# Patient Record
Sex: Female | Born: 1940 | Race: White | Hispanic: No | Smoking: Former smoker
Health system: Southern US, Community
[De-identification: ages and names within clinical notes are randomized; demographics above are authoritative.]

## PROBLEM LIST (undated history)

## (undated) DIAGNOSIS — F419 Anxiety disorder, unspecified: Secondary | ICD-10-CM

## (undated) DIAGNOSIS — E7800 Pure hypercholesterolemia, unspecified: Secondary | ICD-10-CM

## (undated) DIAGNOSIS — M810 Age-related osteoporosis without current pathological fracture: Secondary | ICD-10-CM

## (undated) HISTORY — PX: CHOLECYSTECTOMY: SHX55

## (undated) HISTORY — PX: TUBAL LIGATION: SHX77

## (undated) HISTORY — PX: OOPHORECTOMY: SHX86

## (undated) HISTORY — PX: OTHER SURGICAL HISTORY: SHX169

---

## 2019-08-23 ENCOUNTER — Other Ambulatory Visit: Payer: Self-pay | Admitting: Neurology

## 2019-08-23 DIAGNOSIS — G20 Parkinson's disease: Secondary | ICD-10-CM

## 2019-09-06 ENCOUNTER — Ambulatory Visit
Admission: RE | Admit: 2019-09-06 | Discharge: 2019-09-06 | Disposition: A | Discharge status: Home / Self Care | Payer: Medicare HMO | Source: Ambulatory Visit | Attending: Neurology | Admitting: Neurology

## 2019-09-06 ENCOUNTER — Other Ambulatory Visit: Payer: Self-pay

## 2019-09-06 DIAGNOSIS — G20 Parkinson's disease: Secondary | ICD-10-CM | POA: Diagnosis present

## 2019-09-14 ENCOUNTER — Other Ambulatory Visit: Payer: Self-pay

## 2019-09-14 DIAGNOSIS — Z20822 Contact with and (suspected) exposure to covid-19: Secondary | ICD-10-CM

## 2019-09-15 LAB — NOVEL CORONAVIRUS, NAA: SARS-CoV-2, NAA: NOT DETECTED

## 2020-03-31 ENCOUNTER — Encounter: Payer: Self-pay | Admitting: Emergency Medicine

## 2020-03-31 ENCOUNTER — Emergency Department: Payer: Medicare HMO

## 2020-03-31 ENCOUNTER — Other Ambulatory Visit: Payer: Self-pay

## 2020-03-31 ENCOUNTER — Emergency Department
Admission: EM | Admit: 2020-03-31 | Discharge: 2020-03-31 | Disposition: A | Discharge status: Home / Self Care | Payer: Medicare HMO | Attending: Emergency Medicine | Admitting: Emergency Medicine

## 2020-03-31 DIAGNOSIS — Z7982 Long term (current) use of aspirin: Secondary | ICD-10-CM | POA: Insufficient documentation

## 2020-03-31 DIAGNOSIS — Z87891 Personal history of nicotine dependence: Secondary | ICD-10-CM | POA: Diagnosis not present

## 2020-03-31 DIAGNOSIS — J069 Acute upper respiratory infection, unspecified: Secondary | ICD-10-CM | POA: Diagnosis not present

## 2020-03-31 DIAGNOSIS — Z79899 Other long term (current) drug therapy: Secondary | ICD-10-CM | POA: Diagnosis not present

## 2020-03-31 DIAGNOSIS — R05 Cough: Secondary | ICD-10-CM | POA: Diagnosis present

## 2020-03-31 HISTORY — DX: Pure hypercholesterolemia, unspecified: E78.00

## 2020-03-31 MED ORDER — AMOXICILLIN 500 MG PO CAPS: 500.0000 mg | ORAL_CAPSULE | Freq: Three times a day (TID) | ORAL | 0 refills | Status: DC

## 2020-03-31 NOTE — ED Provider Notes (Signed)
[facility] Emergency Department Provider Note  ____________________________________________   First MD Initiated Contact with Patient 03/31/20 1334     (approximate)  I have reviewed the triage vital signs and the nursing notes.   HISTORY  Chief Complaint sinus congestion and Cough    HPI Kaitlyn Rivas is a 79 y.o. female presents emergency department complaining of sinus drainage and cough symptoms for about 5 days.  No fever.  Patient is worried she might have pneumonia.  She denies any chest pain or shortness of breath.  No swelling in the extremities.    Past Medical History:  Diagnosis Date  . High cholesterol     There are no problems to display for this patient.   Past Surgical History:  Procedure Laterality Date  . CHOLECYSTECTOMY      Prior to Admission medications   Medication Sig Start Date End Date Taking? Authorizing Provider  aspirin EC 81 MG tablet Take 81 mg by mouth daily.   Yes [provider]  carbidopa-levodopa (SINEMET IR) 25-250 MG tablet Take 1 tablet by mouth 3 (three) times daily.   Yes [provider]  citalopram (CELEXA) 40 MG tablet Take 40 mg by mouth daily.   Yes [provider]  rosuvastatin (CRESTOR) 10 MG tablet Take 10 mg by mouth daily.   Yes [provider]  amoxicillin (AMOXIL) 500 MG capsule Take 1 capsule (500 mg total) by mouth 3 (three) times daily. 03/31/20   Versie Starks, PA-C    Allergies Patient has no known allergies.  History reviewed. No pertinent family history.  Social History Social History   Tobacco Use  . Smoking status: Former Research scientist (life sciences)  . Smokeless tobacco: Never Used  Substance Use Topics  . Alcohol use: Not Currently  . Drug use: Not Currently    Review of Systems  Constitutional: No fever/chills Eyes: No visual changes. ENT: No sore throat. Respiratory: Positive cough Cardiovascular: Denies chest pain Genitourinary: Negative for  dysuria. Musculoskeletal: Negative for back pain. Skin: Negative for rash. Psychiatric: no mood changes,     ____________________________________________   PHYSICAL EXAM:  VITAL SIGNS: ED Triage Vitals  Enc Vitals Group     BP 03/31/20 1241 131/73     Pulse Rate 03/31/20 1241 80     Resp 03/31/20 1241 16     Temp 03/31/20 1241 97.8 F (36.6 C)     Temp Source 03/31/20 1241 Oral     SpO2 03/31/20 1241 94 %     Weight 03/31/20 1238 118 lb (53.5 kg)     Height 03/31/20 1238 4\' 11"  (1.499 m)     Head Circumference --      Peak Flow --      Pain Score 03/31/20 1238 0     Pain Loc --      Pain Edu? --      Excl. in [place]? --     Constitutional: Alert and oriented. Well appearing and in no acute distress. Eyes: Conjunctivae are normal.  Head: Atraumatic. Nose: No congestion/rhinnorhea. Mouth/Throat: Mucous membranes are moist.   Neck:  supple no lymphadenopathy noted Cardiovascular: Normal rate, regular rhythm. Heart sounds are normal Respiratory: Normal respiratory effort.  No retractions, lungs c t a  GU: deferred Musculoskeletal: FROM all extremities, warm and well perfused Neurologic:  Normal speech and language.  Skin:  Skin is warm, dry and intact. No rash noted. Psychiatric: Mood and affect are normal. Speech and behavior are normal.  ____________________________________________  LABS (all labs ordered are listed, but only abnormal results are displayed)  Labs Reviewed - No data to display ____________________________________________   ____________________________________________  RADIOLOGY  Chest x-ray is normal  ____________________________________________   PROCEDURES  Procedure(s) performed: No  Procedures    ____________________________________________   INITIAL IMPRESSION / ASSESSMENT AND PLAN / ED COURSE  Pertinent labs & imaging results that were available during my care of the patient were reviewed by me and considered in my medical  decision making (see chart for details).   Patient is an 79 year old female with cough and congestion for 5 days.  Concerns for pneumonia.  Patient has had both Covid vaccines.  Physical exam is very reassuring.  Patient's vitals appear to be normal.  She is in no distress at all. No  labored breathing or retractions.  Chest x-ray is normal  I did explain the findings to the patient.  Explained she does not have pneumonia.  Place her on antibiotic and she is to follow-up with her regular doctor.  Return emergency department worsening.  She states she understands will comply.  She is discharged stable condition.    Kaitlyn Rivas was evaluated in Emergency Department on 03/31/2020 for the symptoms described in the history of present illness. She was evaluated in the context of the global COVID-19 pandemic, which necessitated consideration that the patient might be at risk for infection with the SARS-CoV-2 virus that causes COVID-19. Institutional protocols and algorithms that pertain to the evaluation of patients at risk for COVID-19 are in a state of rapid change based on information released by regulatory bodies including the CDC and federal and state organizations. These policies and algorithms were followed during the patient's care in the ED.   As part of my medical decision making, I reviewed the following data within the electronic MEDICAL RECORD NUMBER Nursing notes reviewed and incorporated, Old chart reviewed, Radiograph reviewed , Notes from prior ED visits and [place] Controlled Substance Database  ____________________________________________   FINAL CLINICAL IMPRESSION(S) / ED DIAGNOSES  Final diagnoses:  Acute upper respiratory infection      NEW MEDICATIONS STARTED DURING THIS VISIT:  New Prescriptions   AMOXICILLIN (AMOXIL) 500 MG CAPSULE    Take 1 capsule (500 mg total) by mouth 3 (three) times daily.     Note:  This document was prepared using Dragon voice recognition software  and may include unintentional dictation errors.    Faythe Ghee, PA-C 03/31/20 1819    Jene Every, MD 04/05/20 [phone]

## 2020-03-31 NOTE — ED Notes (Signed)
See triage note   Presents with sinus pressure and cough  sxs' started about 5 days ago  No fever

## 2020-03-31 NOTE — ED Triage Notes (Signed)
C/O sinus congestion, post nasal drip and cough x 5 days.  AAOx3. Skin warm and dry. No SOB/ DOE.  NAD

## 2021-04-23 ENCOUNTER — Other Ambulatory Visit: Payer: Self-pay | Admitting: Internal Medicine

## 2021-04-23 DIAGNOSIS — R1313 Dysphagia, pharyngeal phase: Secondary | ICD-10-CM

## 2021-05-07 ENCOUNTER — Other Ambulatory Visit: Payer: Self-pay

## 2021-05-07 ENCOUNTER — Other Ambulatory Visit: Payer: Self-pay | Admitting: Internal Medicine

## 2021-05-07 ENCOUNTER — Ambulatory Visit
Admission: RE | Admit: 2021-05-07 | Discharge: 2021-05-07 | Disposition: A | Discharge status: Home / Self Care | Payer: Medicare HMO | Source: Ambulatory Visit | Attending: Internal Medicine | Admitting: Internal Medicine

## 2021-05-07 DIAGNOSIS — R1313 Dysphagia, pharyngeal phase: Secondary | ICD-10-CM

## 2021-05-22 ENCOUNTER — Other Ambulatory Visit: Payer: Self-pay | Admitting: Internal Medicine

## 2021-05-22 DIAGNOSIS — R1313 Dysphagia, pharyngeal phase: Secondary | ICD-10-CM

## 2021-05-22 DIAGNOSIS — T17908A Unspecified foreign body in respiratory tract, part unspecified causing other injury, initial encounter: Secondary | ICD-10-CM

## 2021-06-02 ENCOUNTER — Ambulatory Visit
Admission: RE | Admit: 2021-06-02 | Discharge: 2021-06-02 | Disposition: A | Discharge status: Home / Self Care | Payer: Medicare HMO | Source: Ambulatory Visit | Attending: Internal Medicine | Admitting: Internal Medicine

## 2021-06-02 ENCOUNTER — Other Ambulatory Visit: Payer: Self-pay

## 2021-06-02 DIAGNOSIS — R1313 Dysphagia, pharyngeal phase: Secondary | ICD-10-CM | POA: Insufficient documentation

## 2021-06-02 DIAGNOSIS — T17908A Unspecified foreign body in respiratory tract, part unspecified causing other injury, initial encounter: Secondary | ICD-10-CM

## 2021-06-02 NOTE — Therapy (Addendum)
[facility] DIAGNOSTIC RADIOLOGY [address] [place], [place], [address] Phone: [phone]   Fax:     Modified Barium Swallow  Patient Details  Name: Kaitlyn Rivas MRN: [id number] Date of Birth: [birth year] No data recorded  Encounter Date: 06/02/2021   End of Session - 06/02/21 1655     Visit Number 1    Number of Visits 1    Date for SLP Re-Evaluation 06/02/21    SLP Start Time 1305    SLP Stop Time  1415    SLP Time Calculation (min) 70 min    Activity Tolerance Patient tolerated treatment well             Past Medical History:  Diagnosis Date   High cholesterol     Past Surgical History:  Procedure Laterality Date   CHOLECYSTECTOMY      There were no vitals filed for this visit.   Subjective: Patient behavior: (alertness, ability to follow instructions, etc.): pt talkative, pleasant. Gave clear information and details re: her "difficulty" swallowing -- "I have episodes of Regurgitation of foods when I eat at meals" for ~1+ years now. This is bothersome to her b/c it has occurred when eating w/ others, and she feels her voice changes "some days". Pt stated she has "reflux"; had "polyps removed from my Esophagus before"; had "stomach surgery 40 years ago"; and "I have always had problems w/ my stomach". Intermittent difficulty swallowing Large Pills at home reported. Pt denied any Neurological or Pulmonary h/o -- No dxs of pneumonia "at all" per pt report.  Chief complaint: dysphagia  OM Exam: WNL. No unilateral weakness. Cough+. Gaga+. Upper Denture plate only - no bottom Dentition baseline.   Objective:  Radiological Procedure: A videoflouroscopic evaluation of oral-preparatory, reflex initiation, and pharyngeal phases of the swallow was performed; as well as a screening of the upper esophageal phase.  POSTURE: upright VIEW: lateral COMPENSATORY STRATEGIES: mild throat clearing intermittently b/t trials and at end of  po's taken to clear any potential laryngeal vestibule residue BOLUSES ADMINISTERED:  Thin Liquid: 9 trials  Nectar-thick Liquid: 1 trial  Honey-thick Liquid: NT  Puree: 3 trials  Mechanical Soft: 1 trial RESULTS OF EVALUATION: ORAL PREPARATORY PHASE: (The lips, tongue, and velum are observed for strength and coordination)       **Overall Severity Rating: WFL.   SWALLOW INITIATION/REFLEX: (The reflex is normal if "triggered" by the time the bolus reached the base of the tongue)  **Overall Severity Rating: MILD. Mild delay in pharyngeal swallow initiation w/ thin liquids spilling to/filling the pyriform sinuses -- this resulted in inconsistent laryngeal Penetration of thin liquids into the laryngeal vestibule. NO aspiration noted to occur.   PHARYNGEAL PHASE: (Pharyngeal function is normal if the bolus shows rapid, smooth, and continuous transit through the pharynx and there is no pharyngeal residue after the swallow)  **Overall Severity Rating: [facility]   LARYNGEAL PENETRATION: (Material entering into the laryngeal inlet/vestibule but not aspirated): inconsistent slight-min laryngeal Penetration noted w/ thin liquids. Majority of the bolus appeared to clear w/ completion of the swallow; any residue appeared to clear w/ mild throat clearing intermittently. NO buildup of laryngeal Penetration noted in the Laryngeal Vestibule as trials continued. NO aspiration followed.  ASPIRATION: NONE ESOPHAGEAL PHASE: (Screening of the upper esophagus): slightly slower clearing of increased textured boluses through the lower Cervical Esophagus (out of view below shoulders). No retrograde activity noted during the bolus trials given.   ASSESSMENT: Pt appears to present w/  adequate and grossly Functional oropharyngeal phase swallowing w/ NO aspiration noted during this study. No neuromuscular deficits noted during the oropharyngeal phases of swallowing except for mildly decreased pharyngeal sensation apparent during  the pharyngeal swallowing of thin liquids trials. 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 Any Esophageal phase dysmotility/deficits can impact the pharyngeal phase of swallowing thus impact pulmonary status.    During the oral phase, no deficits were noted; adequate bolus management, A-P transfer, and oral clearing occurred w/ all trial consistencies. During the pharyngeal phase, pt exhibited a mildly delayed pharyngeal swallow initiation w/ thin liquids(only) which were noted to spill to the pyriform sinuses as the pharyngeal swallow initiation occurred. The mild delay in pharyngeal swallow initiation w/ thin liquids spilling to/filling the pyriform sinuses resulted in Inconsistent laryngeal Penetration of thin liquids into the laryngeal vestibule w/ trace+ residue remaining above the Vocal Cords. The majority of the laryngeal Penetration appeared to clear w/ subsequent swallows. Pt was educated and instructed on using a mild throat clearing intermittently during, and at the end of, oral intake in order to assist in clearing any potential laryngeal vestibule residue -- pt was able to follow through w/ this strategy fully. NO aspiration noted to occur. This can be seen secondary to effects of GERD/LPR impacting sensation of pharynx/pharyngeal tissue. No pharyngeal residue remained post swallow indicating adequate pharyngeal pressure and laryngeal excursion during the swallow. Practice w/ swallowing Barium tablet Whole w/ a Puree was completed w/ excellent timing/swallowing/clearing -- pt was surprised and pleased.   Time during/after was taken to view video w/ pt, educate on oropharyngeal phase swallowing, and discuss the impact of the Esophageal  phase on swallowing and oral intake in general. Recommendation was given to continue f/u w/ GI for ongoing assessment/management of any Esophageal Dysmotility and GERD/LPR; education. Encouraged pt to monitor her pulmonary status along w/ her PCP for any changes including upper airway congestion, pneumonia.  Handouts were given on GERD/REFLUX precautions, Esophageal phase precautions, and diet recommendations. Pt agreed verbally.   PLAN/RECOMMENDATIONS:  A. Diet: Regular, Cut meats for ease of mastication d/t missing Lower Denture plate/Dentition Baseline. Foods Cut Small/Moistened well w/ less Breads and Meats in diet d/t Esophageal phase Dysmotility. Pills WHOLE in tsp of Puree for ease, safety of swallowing. Pt agreed.  B. Swallowing Precautions: general aspiration and REFLUX precautions (handouts given). Intermittent, mild throat clearing during and at end of meal for clearing of any potential laryngeal vestibule residue  C. Recommended consultation to: GI for f/u w/ Esophageal phase Dysmotility and Regurgitation episodes; tx and management  D. Therapy recommendations: None indicated at this time  E. Results and recommendations were discussed w/ pt; video viewed and questions answered. Handouts given on recommendations discussed and practiced. Pt gave verbal agreement.       Pharyngeal dysphagia Plan: DG SWALLOW FUNC OP MEDICARE SPEECH PATH, DG SWALLOW FUNC OP MEDICARE SPEECH PATH    Problem List There are no problems to display for this patient.    Jerilynn Som, MS, CCC-SLP Speech Language Pathologist Rehab Services [phone] [facility] 06/02/2021, 5:03 PM  [place] [facility] DIAGNOSTIC RADIOLOGY [address] [place], [place], [address] Phone: [phone]   Fax:     Name: Juliet Vasbinder MRN: [id number] Date of Birth: 1941-06-03

## 2021-07-14 ENCOUNTER — Encounter: Payer: Self-pay | Admitting: Internal Medicine

## 2021-07-15 ENCOUNTER — Ambulatory Visit: Payer: Medicare HMO | Admitting: Certified Registered Nurse Anesthetist

## 2021-07-15 ENCOUNTER — Ambulatory Visit
Admission: RE | Admit: 2021-07-15 | Discharge: 2021-07-15 | Disposition: A | Discharge status: Home / Self Care | Payer: Medicare HMO | Source: Ambulatory Visit | Attending: Internal Medicine | Admitting: Internal Medicine

## 2021-07-15 ENCOUNTER — Encounter
Admission: RE | Disposition: A | Discharge status: Home / Self Care | Payer: Self-pay | Source: Ambulatory Visit | Attending: Internal Medicine

## 2021-07-15 ENCOUNTER — Encounter: Payer: Self-pay | Admitting: Internal Medicine

## 2021-07-15 DIAGNOSIS — Z7982 Long term (current) use of aspirin: Secondary | ICD-10-CM | POA: Diagnosis not present

## 2021-07-15 DIAGNOSIS — Z980 Intestinal bypass and anastomosis status: Secondary | ICD-10-CM | POA: Diagnosis not present

## 2021-07-15 DIAGNOSIS — K289 Gastrojejunal ulcer, unspecified as acute or chronic, without hemorrhage or perforation: Secondary | ICD-10-CM | POA: Diagnosis not present

## 2021-07-15 DIAGNOSIS — D72820 Lymphocytosis (symptomatic): Secondary | ICD-10-CM | POA: Diagnosis not present

## 2021-07-15 DIAGNOSIS — Q396 Congenital diverticulum of esophagus: Secondary | ICD-10-CM | POA: Insufficient documentation

## 2021-07-15 DIAGNOSIS — R1313 Dysphagia, pharyngeal phase: Secondary | ICD-10-CM | POA: Insufficient documentation

## 2021-07-15 DIAGNOSIS — Z79899 Other long term (current) drug therapy: Secondary | ICD-10-CM | POA: Insufficient documentation

## 2021-07-15 DIAGNOSIS — K2950 Unspecified chronic gastritis without bleeding: Secondary | ICD-10-CM | POA: Insufficient documentation

## 2021-07-15 DIAGNOSIS — K2289 Other specified disease of esophagus: Secondary | ICD-10-CM | POA: Diagnosis not present

## 2021-07-15 DIAGNOSIS — K449 Diaphragmatic hernia without obstruction or gangrene: Secondary | ICD-10-CM | POA: Insufficient documentation

## 2021-07-15 DIAGNOSIS — K222 Esophageal obstruction: Secondary | ICD-10-CM | POA: Diagnosis not present

## 2021-07-15 HISTORY — DX: Anxiety disorder, unspecified: F41.9

## 2021-07-15 HISTORY — DX: Age-related osteoporosis without current pathological fracture: M81.0

## 2021-07-15 HISTORY — PX: ESOPHAGOGASTRODUODENOSCOPY (EGD) WITH PROPOFOL: SHX5813

## 2021-07-15 SURGERY — ESOPHAGOGASTRODUODENOSCOPY (EGD) WITH PROPOFOL
Anesthesia: General

## 2021-07-15 MED ORDER — SODIUM CHLORIDE 0.9 % IV SOLN: INTRAVENOUS | Status: DC

## 2021-07-15 MED ORDER — LIDOCAINE HCL (PF) 2 % IJ SOLN
INTRAMUSCULAR | Status: AC
  Filled 2021-07-15: qty 5

## 2021-07-15 MED ORDER — PROPOFOL 10 MG/ML IV BOLUS
INTRAVENOUS | Status: DC | PRN
  Administered 2021-07-15: 50 mg via INTRAVENOUS

## 2021-07-15 MED ORDER — PROPOFOL 500 MG/50ML IV EMUL
INTRAVENOUS | Status: DC | PRN
  Administered 2021-07-15: 150 ug/kg/min via INTRAVENOUS

## 2021-07-15 MED ORDER — LIDOCAINE HCL (CARDIAC) PF 100 MG/5ML IV SOSY
PREFILLED_SYRINGE | INTRAVENOUS | Status: DC | PRN
  Administered 2021-07-15: 50 mg via INTRAVENOUS

## 2021-07-15 MED ORDER — PROPOFOL 500 MG/50ML IV EMUL
INTRAVENOUS | Status: AC
  Filled 2021-07-15: qty 50

## 2021-07-15 NOTE — H&P (Signed)
Outpatient short stay form Pre-procedure 07/15/2021 9:57 AM Cornellius Rivas K. [name], M.D.  Primary Physician: Debbra Riding, PA-C  Reason for visit: Recurrent pharyngeal dysphagia  History of present illness: Pleasant 80 year old female presents with intermittent solid food and liquid dysphagia at the base of the throat to the suprasternal notch.  Recent modified barium swallow performed on 06/02/2021 was unremarkable.  Patient presents for upper endoscopy for further evaluation and possible esophageal dilation.    Current Facility-Administered Medications:    0.9 %  sodium chloride infusion, , Intravenous, Continuous, Lennis Rader, Boykin Nearing, MD  Medications Prior to Admission  Medication Sig Dispense Refill Last Dose   aspirin EC 81 MG tablet Take 81 mg by mouth daily.   07/14/2021   citalopram (CELEXA) 40 MG tablet Take 40 mg by mouth daily.   07/14/2021   Multiple Vitamin (MULTIVITAMIN) tablet Take 1 tablet by mouth daily.   07/14/2021   pravastatin (PRAVACHOL) 10 MG tablet Take 10 mg by mouth daily.   07/14/2021   vitamin B-12 (CYANOCOBALAMIN) 1000 MCG tablet Take 1,000 mcg by mouth daily.   07/14/2021   Vitamin D3 (VITAMIN D) 25 MCG tablet Take 1,000 Units by mouth daily.      amoxicillin (AMOXIL) 500 MG capsule Take 1 capsule (500 mg total) by mouth 3 (three) times daily. (Patient not taking: Reported on 07/14/2021) 30 capsule 0 Not Taking   carbidopa-levodopa (SINEMET IR) 25-250 MG tablet Take 1 tablet by mouth 3 (three) times daily. (Patient not taking: Reported on 07/15/2021)   Not Taking   omeprazole (PRILOSEC) 20 MG capsule Take 20 mg by mouth daily. (Patient not taking: Reported on 07/15/2021)   Not Taking     No Known Allergies   Past Medical History:  Diagnosis Date   Anxiety    High cholesterol    Osteoporosis     Review of systems:  Otherwise negative.    Physical Exam  Gen: Alert, oriented. Appears stated age.  HEENT: [place]/AT. PERRLA. Lungs: CTA, no wheezes. CV: RR nl S1, S2. Abd:  soft, benign, no masses. BS+ Ext: No edema. Pulses 2+    Planned procedures: Proceed with esophagogastroduodenoscopy. The patient understands the nature of the planned procedure, indications, risks, alternatives and potential complications including but not limited to bleeding, infection, perforation, damage to internal organs and possible oversedation/side effects from anesthesia. The patient agrees and gives consent to proceed.  Please refer to procedure notes for findings, recommendations and patient disposition/instructions.     Kaitlyn Rivas, M.D. Gastroenterology 07/15/2021  9:57 AM

## 2021-07-15 NOTE — Anesthesia Preprocedure Evaluation (Signed)
Anesthesia Evaluation  Patient identified by MRN, date of birth, ID band Patient awake    Reviewed: Allergy & Precautions, NPO status , Patient's Chart, lab work & pertinent test results  History of Anesthesia Complications Negative for: history of anesthetic complications  Airway Mallampati: III  TM Distance: >3 FB Neck ROM: limited    Dental  (+) Missing   Pulmonary neg shortness of breath, former smoker,    Pulmonary exam normal        Cardiovascular Exercise Tolerance: Good (-) angina(-) Past MI negative cardio ROS Normal cardiovascular exam     Neuro/Psych Anxiety negative neurological ROS  negative psych ROS   GI/Hepatic negative GI ROS, Neg liver ROS, neg GERD  ,  Endo/Other  negative endocrine ROS  Renal/GU negative Renal ROS  negative genitourinary   Musculoskeletal   Abdominal   Peds  Hematology negative hematology ROS (+)   Anesthesia Other Findings Past Medical History: No date: Anxiety No date: High cholesterol No date: Osteoporosis  Past Surgical History: No date: CHOLECYSTECTOMY No date: excision stomach ulcer No date: OOPHORECTOMY No date: TUBAL LIGATION  BMI    Body Mass Index: 25.45 kg/m      Reproductive/Obstetrics negative OB ROS                             Anesthesia Physical Anesthesia Plan  ASA: 2  Anesthesia Plan: General   Post-op Pain Management:    Induction: Intravenous  PONV Risk Score and Plan: Propofol infusion and TIVA  Airway Management Planned: Natural Airway and Nasal Cannula  Additional Equipment:   Intra-op Plan:   Post-operative Plan:   Informed Consent: I have reviewed the patients History and Physical, chart, labs and discussed the procedure including the risks, benefits and alternatives for the proposed anesthesia with the patient or authorized representative who has indicated his/her understanding and acceptance.      Dental Advisory Given  Plan Discussed with: Anesthesiologist, CRNA and Surgeon  Anesthesia Plan Comments: (Patient consented for risks of anesthesia including but not limited to:  - adverse reactions to medications - risk of airway placement if required - damage to eyes, teeth, lips or other oral mucosa - nerve damage due to positioning  - sore throat or hoarseness - Damage to heart, brain, nerves, lungs, other parts of body or loss of life  Patient voiced understanding.)        Anesthesia Quick Evaluation

## 2021-07-15 NOTE — Transfer of Care (Signed)
Immediate Anesthesia Transfer of Care Note  Patient: Kaitlyn Rivas  Procedure(s) Performed: ESOPHAGOGASTRODUODENOSCOPY (EGD) WITH PROPOFOL  Patient Location: PACU and Endoscopy Unit  Anesthesia Type:General  Level of Consciousness: drowsy  Airway & Oxygen Therapy: Patient Spontanous Breathing  Post-op Assessment: Report given to RN and Post -op Vital signs reviewed and stable  Post vital signs: Reviewed and stable  Last Vitals:  Vitals Value Taken Time  BP    Temp    Pulse 63 07/15/21 1023  Resp 19 07/15/21 1023  SpO2 100 % 07/15/21 1023  Vitals shown include unvalidated device data.  Last Pain:  Vitals:   07/15/21 0908  TempSrc: Temporal  PainSc: 0-No pain         Complications: No notable events documented.

## 2021-07-15 NOTE — Anesthesia Postprocedure Evaluation (Signed)
Anesthesia Post Note  Patient: Kaitlyn Rivas  Procedure(s) Performed: ESOPHAGOGASTRODUODENOSCOPY (EGD) WITH PROPOFOL  Patient location during evaluation: Endoscopy Anesthesia Type: General Level of consciousness: awake and alert Pain management: pain level controlled Vital Signs Assessment: post-procedure vital signs reviewed and stable Respiratory status: spontaneous breathing, nonlabored ventilation, respiratory function stable and patient connected to nasal cannula oxygen Cardiovascular status: blood pressure returned to baseline and stable Postop Assessment: no apparent nausea or vomiting Anesthetic complications: no   No notable events documented.   Last Vitals:  Vitals:   07/15/21 1022 07/15/21 1042  BP: 128/75 (!) 142/78  Pulse: 64   Resp: 19   Temp: (!) 36.4 C   SpO2: 99%     Last Pain:  Vitals:   07/15/21 1042  TempSrc:   PainSc: 0-No pain                 Cleda Mccreedy Graceson Nichelson

## 2021-07-15 NOTE — Interval H&P Note (Signed)
History and Physical Interval Note:  07/15/2021 9:59 AM  Kaitlyn Rivas  has presented today for surgery, with the diagnosis of DYSPHAGIA.  The various methods of treatment have been discussed with the patient and family. After consideration of risks, benefits and other options for treatment, the patient has consented to  Procedure(s): ESOPHAGOGASTRODUODENOSCOPY (EGD) WITH PROPOFOL (N/A) as a surgical intervention.  The patient's history has been reviewed, patient examined, no change in status, stable for surgery.  I have reviewed the patient's chart and labs.  Questions were answered to the patient's satisfaction.     Welcome, [place]

## 2021-07-15 NOTE — Op Note (Signed)
[facility] Gastroenterology Patient Name: Kaitlyn Rivas Procedure Date: 07/15/2021 9:50 AM MRN: [id number] Account #: [id number] Date of Birth: 11/03/1941 Admit Type: Outpatient Age: 80 Room: [facility] ENDO ROOM 2 Gender: Female Note Status: Finalized Instrument Name: Upper Endoscope [id number] Procedure:             Upper GI endoscopy Indications:           Pharyngeal phase dysphagia, Suspected esophageal reflux Providers:             Royce Macadamia K. Amandeep Nesmith MD, MD Medicines:             Propofol per Anesthesia Complications:         No immediate complications. Estimated blood loss: None. Procedure:             Pre-Anesthesia Assessment:                        - Prior to the procedure, a History and Physical was                         performed, and patient medications, allergies and                         sensitivities were reviewed. The patient's tolerance                         of previous anesthesia was reviewed.                        - The risks and benefits of the procedure and the                         sedation options and risks were discussed with the                         patient. All questions were answered and informed                         consent was obtained.                        - Patient identification and proposed procedure were                         verified prior to the procedure by the nurse. The                         procedure was verified in the procedure room.                        After obtaining informed consent, the endoscope was                         passed under direct vision. Throughout the procedure,                         the patient's blood pressure, pulse, and oxygen                         saturations were monitored continuously.  The Endoscope                         was introduced through the mouth, and advanced to the                         proximal jejunum. The upper GI endoscopy was                          accomplished without difficulty. The patient tolerated                         the procedure well. Findings:      Mucosal changes including feline appearance were found in the entire       esophagus. Biopsies were obtained from the proximal and distal esophagus       with cold forceps for histology of suspected eosinophilic esophagitis.      A non-bleeding diverticulum with a small opening and no stigmata of       recent bleeding was found in the lower third of the esophagus.      One benign-appearing, intrinsic mild stenosis was found at the       gastroesophageal junction. This stenosis measured 1.4 cm (inner       diameter) x less than one cm (in length). The stenosis was traversed. A       TTS dilator was passed through the scope. Dilation with a 15-16.5-18 mm       balloon dilator was performed to 18 mm. The dilation site was examined       following endoscope reinsertion and showed mild mucosal disruption.       Estimated blood loss: none.      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 A gastric       pouch with a large size was found. The gastroduodenal anastomosis was       characterized by erosion and ulceration. This was traversed. Biopsies       were taken with a cold forceps for Helicobacter pylori testing.      A 1 cm hiatal hernia was present.      The examined duodenum was normal.      The examined jejunum was normal.      The exam was otherwise without abnormality. Impression:            - Esophageal mucosal changes suggestive of                         eosinophilic esophagitis. Biopsied.                        - Diverticulum in the lower third of the esophagus.                        - Benign-appearing esophageal stenosis. Dilated.                        - Patent Billroth I gastroduodenostomy was found,                         characterized by erosion and ulceration. Biopsied.                        -  1 cm hiatal hernia.                        -  Normal examined duodenum.                        - Normal examined jejunum.                        - The examination was otherwise normal. Recommendation:        - Await pathology results.                        - Patient has a contact number available for                         emergencies. The signs and symptoms of potential                         delayed complications were discussed with the patient.                         Return to normal activities tomorrow. Written                         discharge instructions were provided to the patient.                        - Resume previous diet.                        - Continue present medications.                        - Return to my office in 2 months.                        - The findings and recommendations were discussed with                         the patient. Procedure Code(s):     --- Professional ---                        [phone], Esophagogastroduodenoscopy, flexible,                         transoral; with transendoscopic balloon dilation of                         esophagus (less than 30 mm diameter)                        43239, 59, Esophagogastroduodenoscopy, flexible,                         transoral; with biopsy, single or multiple Diagnosis Code(s):     --- Professional ---                        R13.13, Dysphagia, pharyngeal phase  K44.9, Diaphragmatic hernia without obstruction or                         gangrene                        K22.2, Esophageal obstruction                        Z98.0, Intestinal bypass and anastomosis status                        Q39.6, Congenital diverticulum of esophagus                        K22.8, Other specified diseases of esophagus CPT copyright 2019 [employer] Association. All rights reserved. The codes documented in this report are preliminary and upon coder review may  be revised to meet current compliance requirements. Stanton Kidneyeodoro K Voshon Petro MD,  MD 07/15/2021 10:27:27 AM This report has been signed electronically. Number of Addenda: 0 Note Initiated On: 07/15/2021 9:50 AM Estimated Blood Loss:  Estimated blood loss: none.      [facility]

## 2021-07-16 ENCOUNTER — Encounter: Payer: Self-pay | Admitting: Internal Medicine

## 2021-07-17 LAB — SURGICAL PATHOLOGY

## 2021-12-28 IMAGING — RF DG SWALLOWING FUNCTION
8 series · 14 of 24 positions shown · non-contrast
Comparison: None.

CLINICAL DATA: Dysphagia. Aspiration.

EXAM:
MODIFIED BARIUM SWALLOW
TECHNIQUE: Different consistencies of barium were administered orally to the
patient by the Speech Pathologist. Imaging of the pharynx was
performed in the lateral projection. The radiologist was present in
the fluoroscopy room for this study, providing personal supervision.
FLUOROSCOPY TIME:  Fluoroscopy Time:  1.5 minute
Radiation Exposure Index (if provided by the fluoroscopic device): 4
mGy
Number of Acquired Spot Images: 0

[Series 1: cp_standard · 0.17mm/px · 2 of 84 frames shown (1 of 8)]
[frame 13/84]
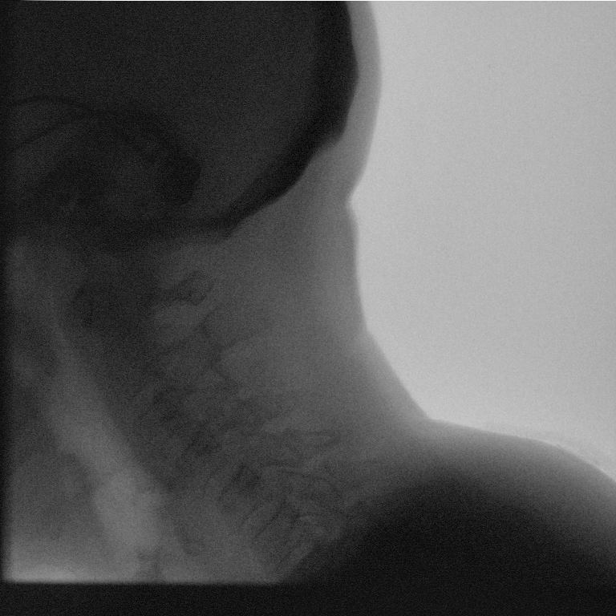
[frame 72/84]
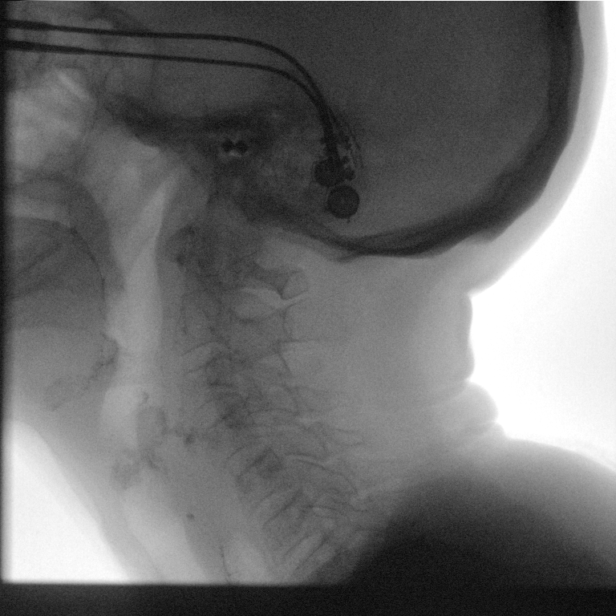

[Series 2: cp_standard · 0.17mm/px · 1 of 179 frames shown (2 of 8)]
[frame 27/179]
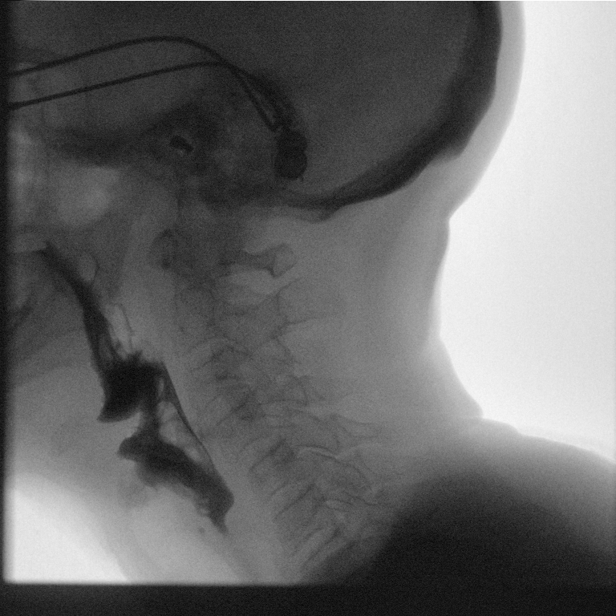

[Series 3: cp_standard · 0.17mm/px · 2 of 75 frames shown (3 of 8)]
[frame 5/75]
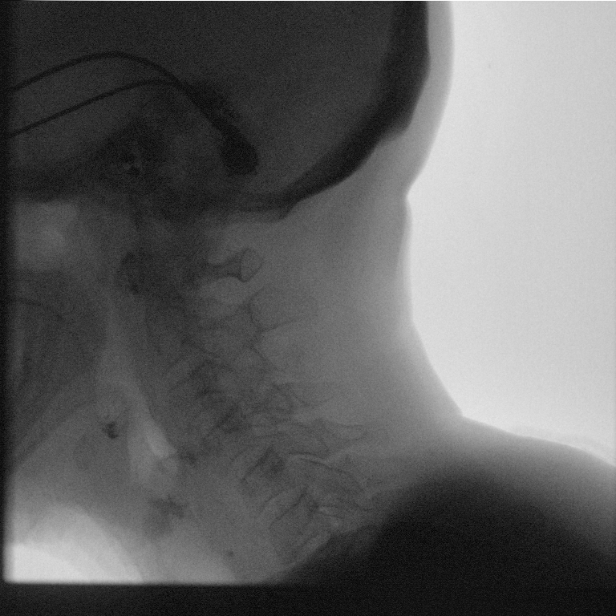
[frame 12/75]
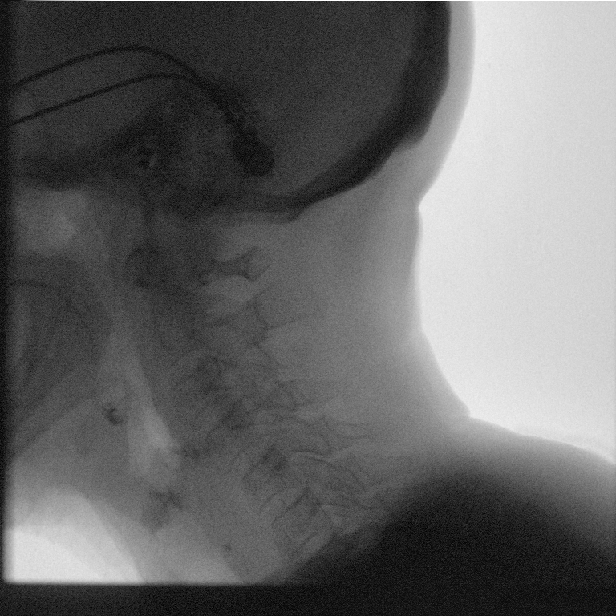

[Series 4: cp_standard · 0.17mm/px · 2 of 121 frames shown (4 of 8)]
[frame 19/121]
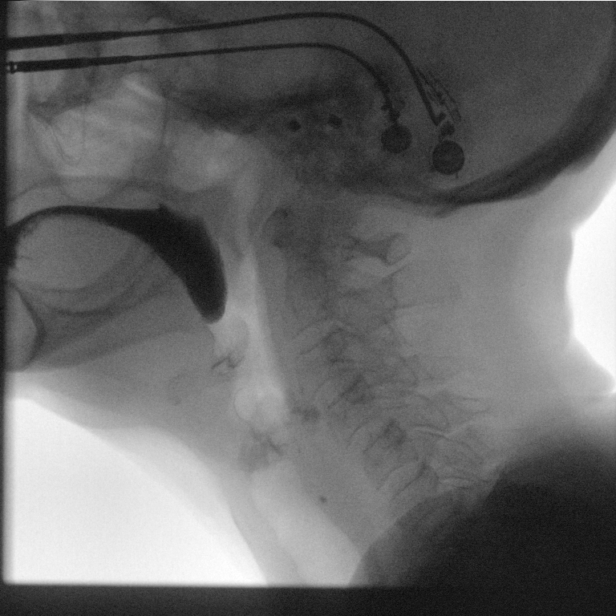
[frame 119/121]
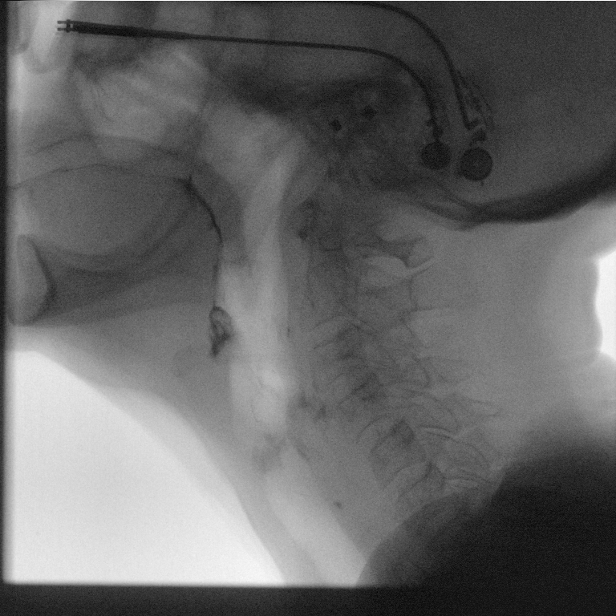

[Series 5: cp_standard · 0.17mm/px · 2 of 341 frames shown (5 of 8)]
[frame 52/341]
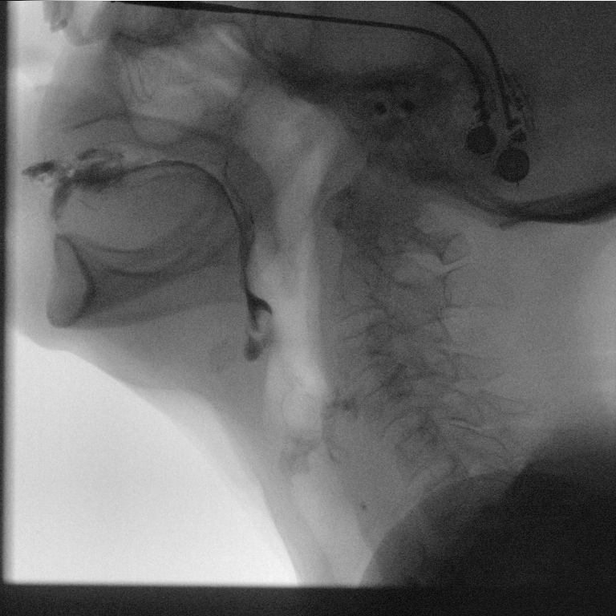
[frame 290/341]
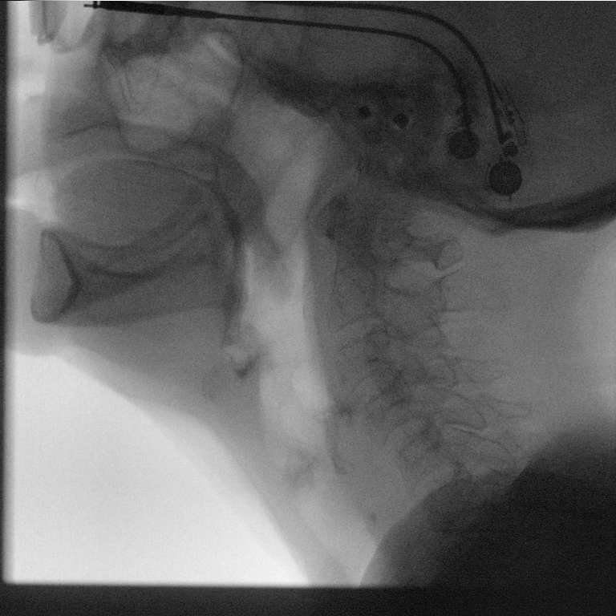

[Series 6: cp_standard · 0.17mm/px · 1 of 34 frames shown (6 of 8)]
[frame 25/34]
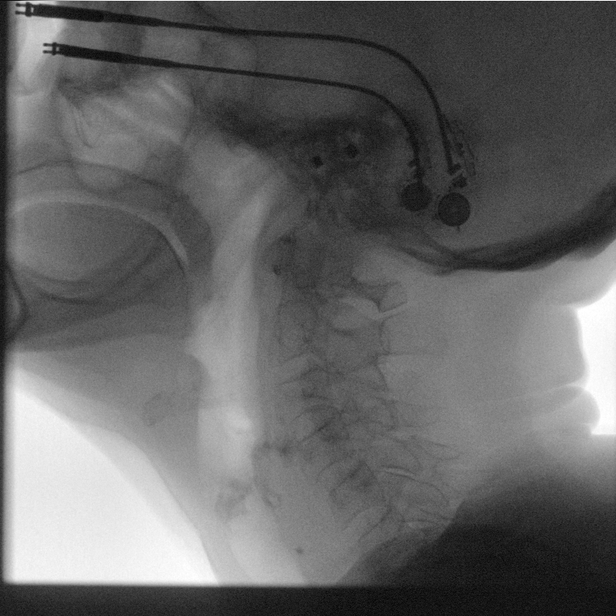

[Series 7: cp_standard · 0.17mm/px · 2 of 161 frames shown (7 of 8)]
[frame 2/161]
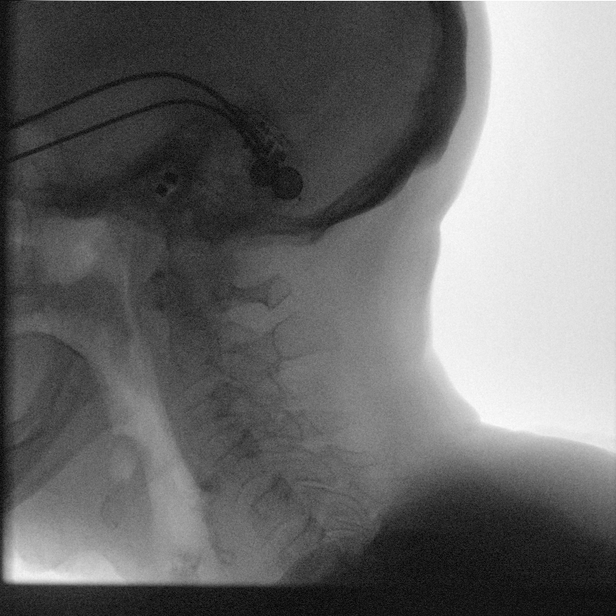
[frame 81/161]
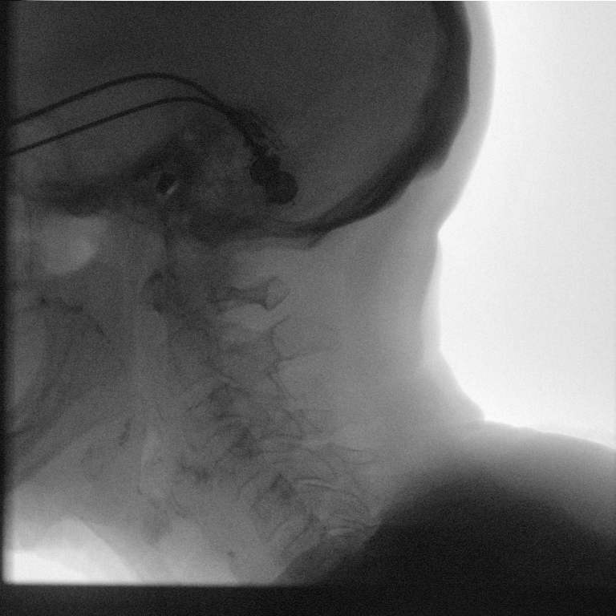

[Series 8: cp_standard · 0.17mm/px · 2 of 99 frames shown (8 of 8)]
[frame 15/99]
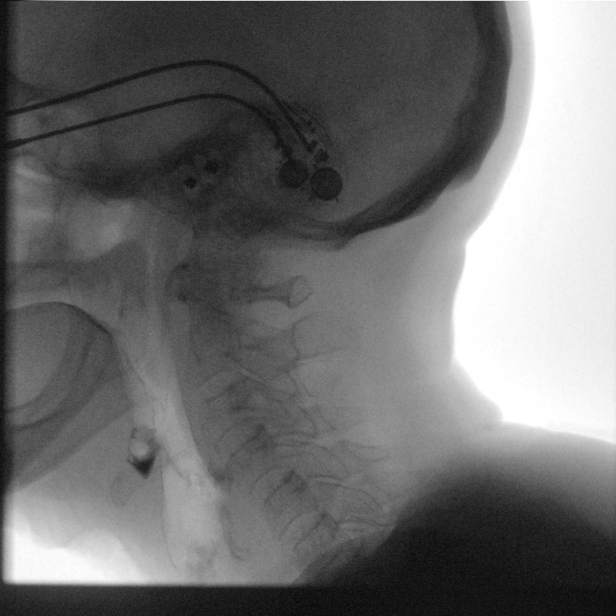
[frame 85/99]
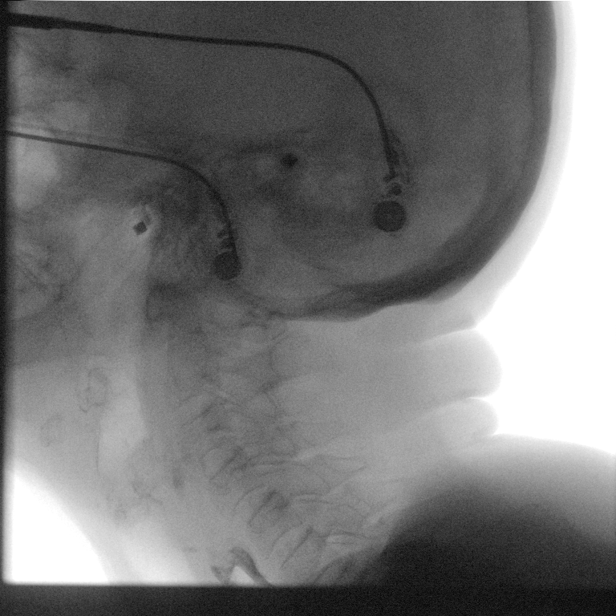

[14 of 24 positions shown; findings below may reference images not displayed]

FINDINGS: Real-time fluoroscopy of the swallowing function was performed with
a speech pathologist present.

Multiple consistencies of barium were administered which included
water, nectar, applesauce, and Erva Nur cracker.

Normal swallow function. No laryngeal penetration or tracheal
aspiration.
IMPRESSION: Modified barium swallow as described above.

Please refer to the Speech Pathologists report for complete details
and recommendations.

## 2022-07-10 ENCOUNTER — Emergency Department: Payer: Medicare HMO

## 2022-07-10 ENCOUNTER — Other Ambulatory Visit: Payer: Self-pay

## 2022-07-10 ENCOUNTER — Emergency Department
Admission: EM | Admit: 2022-07-10 | Discharge: 2022-07-10 | Disposition: A | Discharge status: Home / Self Care | Payer: Medicare HMO | Attending: Emergency Medicine | Admitting: Emergency Medicine

## 2022-07-10 ENCOUNTER — Encounter: Payer: Self-pay | Admitting: Emergency Medicine

## 2022-07-10 DIAGNOSIS — M25512 Pain in left shoulder: Secondary | ICD-10-CM | POA: Insufficient documentation

## 2022-07-10 DIAGNOSIS — S0990XA Unspecified injury of head, initial encounter: Secondary | ICD-10-CM | POA: Insufficient documentation

## 2022-07-10 DIAGNOSIS — Y92410 Unspecified street and highway as the place of occurrence of the external cause: Secondary | ICD-10-CM | POA: Diagnosis not present

## 2022-07-10 DIAGNOSIS — S39012A Strain of muscle, fascia and tendon of lower back, initial encounter: Secondary | ICD-10-CM | POA: Diagnosis not present

## 2022-07-10 DIAGNOSIS — S199XXA Unspecified injury of neck, initial encounter: Secondary | ICD-10-CM | POA: Diagnosis present

## 2022-07-10 DIAGNOSIS — S161XXA Strain of muscle, fascia and tendon at neck level, initial encounter: Secondary | ICD-10-CM | POA: Diagnosis not present

## 2022-07-10 DIAGNOSIS — M791 Myalgia, unspecified site: Secondary | ICD-10-CM | POA: Insufficient documentation

## 2022-07-10 MED ORDER — ACETAMINOPHEN 325 MG PO TABS
650.0000 mg | ORAL_TABLET | Freq: Once | ORAL | Status: AC
  Administered 2022-07-10: 650 mg via ORAL
  Filled 2022-07-10: qty 2
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
## 2022-07-10 NOTE — ED Notes (Signed)
Reviewed discharge instructions, follow-up care, and OTC pain medication with patient. Patient verbalized understanding of all information reviewed. Patient stable, with no distress noted at this time.  \
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
## 2023-09-23 ENCOUNTER — Other Ambulatory Visit: Payer: Self-pay | Admitting: Family Medicine

## 2023-09-23 DIAGNOSIS — R1310 Dysphagia, unspecified: Secondary | ICD-10-CM

## 2023-09-23 DIAGNOSIS — R1111 Vomiting without nausea: Secondary | ICD-10-CM

## 2023-09-23 DIAGNOSIS — Z0000 Encounter for general adult medical examination without abnormal findings: Secondary | ICD-10-CM

## 2023-09-26 ENCOUNTER — Ambulatory Visit
Admission: RE | Admit: 2023-09-26 | Discharge: 2023-09-26 | Disposition: A | Discharge status: Home / Self Care | Payer: Medicare HMO | Source: Ambulatory Visit | Attending: Family Medicine | Admitting: Family Medicine

## 2023-09-26 DIAGNOSIS — R1310 Dysphagia, unspecified: Secondary | ICD-10-CM | POA: Diagnosis present

## 2023-09-26 DIAGNOSIS — Z0000 Encounter for general adult medical examination without abnormal findings: Secondary | ICD-10-CM | POA: Insufficient documentation

## 2023-09-26 DIAGNOSIS — R1111 Vomiting without nausea: Secondary | ICD-10-CM | POA: Insufficient documentation

## 2024-09-04 ENCOUNTER — Other Ambulatory Visit: Payer: Self-pay | Admitting: Family Medicine

## 2024-09-04 DIAGNOSIS — R10811 Right upper quadrant abdominal tenderness: Secondary | ICD-10-CM

## 2024-09-05 ENCOUNTER — Ambulatory Visit
Admission: RE | Admit: 2024-09-05 | Discharge: 2024-09-05 | Disposition: A | Discharge status: Home / Self Care | Source: Ambulatory Visit | Attending: Family Medicine | Admitting: Family Medicine

## 2024-09-05 DIAGNOSIS — R10811 Right upper quadrant abdominal tenderness: Secondary | ICD-10-CM | POA: Diagnosis present

## 2024-09-18 ENCOUNTER — Other Ambulatory Visit: Payer: Self-pay

## 2024-09-18 ENCOUNTER — Ambulatory Visit: Payer: Self-pay

## 2024-09-18 ENCOUNTER — Ambulatory Visit
Admission: RE | Admit: 2024-09-18 | Discharge: 2024-09-18 | Disposition: A | Discharge status: Home / Self Care | Attending: Internal Medicine | Admitting: Internal Medicine

## 2024-09-18 ENCOUNTER — Encounter
Admission: RE | Disposition: A | Discharge status: Home / Self Care | Payer: Self-pay | Source: Home / Self Care | Attending: Internal Medicine

## 2024-09-18 ENCOUNTER — Encounter: Payer: Self-pay | Admitting: Internal Medicine

## 2024-09-18 DIAGNOSIS — Z79899 Other long term (current) drug therapy: Secondary | ICD-10-CM | POA: Insufficient documentation

## 2024-09-18 DIAGNOSIS — K2100 Gastro-esophageal reflux disease with esophagitis, without bleeding: Secondary | ICD-10-CM | POA: Diagnosis not present

## 2024-09-18 DIAGNOSIS — Q396 Congenital diverticulum of esophagus: Secondary | ICD-10-CM | POA: Insufficient documentation

## 2024-09-18 DIAGNOSIS — Z7982 Long term (current) use of aspirin: Secondary | ICD-10-CM | POA: Diagnosis not present

## 2024-09-18 DIAGNOSIS — R1310 Dysphagia, unspecified: Secondary | ICD-10-CM | POA: Insufficient documentation

## 2024-09-18 DIAGNOSIS — R112 Nausea with vomiting, unspecified: Secondary | ICD-10-CM | POA: Insufficient documentation

## 2024-09-18 DIAGNOSIS — K224 Dyskinesia of esophagus: Secondary | ICD-10-CM | POA: Diagnosis not present

## 2024-09-18 DIAGNOSIS — Z8711 Personal history of peptic ulcer disease: Secondary | ICD-10-CM | POA: Insufficient documentation

## 2024-09-18 DIAGNOSIS — F419 Anxiety disorder, unspecified: Secondary | ICD-10-CM | POA: Insufficient documentation

## 2024-09-18 DIAGNOSIS — Z980 Intestinal bypass and anastomosis status: Secondary | ICD-10-CM | POA: Diagnosis not present

## 2024-09-18 DIAGNOSIS — K5730 Diverticulosis of large intestine without perforation or abscess without bleeding: Secondary | ICD-10-CM | POA: Diagnosis not present

## 2024-09-18 DIAGNOSIS — M810 Age-related osteoporosis without current pathological fracture: Secondary | ICD-10-CM | POA: Insufficient documentation

## 2024-09-18 HISTORY — PX: ESOPHAGOGASTRODUODENOSCOPY: SHX5428

## 2024-09-18 SURGERY — EGD (ESOPHAGOGASTRODUODENOSCOPY)
Anesthesia: General

## 2024-09-18 MED ORDER — PROPOFOL 500 MG/50ML IV EMUL
INTRAVENOUS | Status: DC | PRN
  Administered 2024-09-18: 50 ug/kg/min via INTRAVENOUS

## 2024-09-18 MED ORDER — PROPOFOL 10 MG/ML IV BOLUS
INTRAVENOUS | Status: DC | PRN
  Administered 2024-09-18 (×2): 20 mg via INTRAVENOUS
  Administered 2024-09-18: 10 mg via INTRAVENOUS

## 2024-09-18 MED ORDER — LIDOCAINE HCL (CARDIAC) PF 100 MG/5ML IV SOSY
PREFILLED_SYRINGE | INTRAVENOUS | Status: DC | PRN
  Administered 2024-09-18: 40 mg via INTRAVENOUS

## 2024-09-18 MED ORDER — SODIUM CHLORIDE 0.9 % IV SOLN: INTRAVENOUS | Status: DC

## 2024-09-18 MED ORDER — PROPOFOL 10 MG/ML IV BOLUS
INTRAVENOUS | Status: AC
  Filled 2024-09-18: qty 20

## 2024-09-18 NOTE — Anesthesia Postprocedure Evaluation (Signed)
 Anesthesia Post Note  Patient: Kaitlyn Rivas  Procedure(s) Performed: EGD (ESOPHAGOGASTRODUODENOSCOPY)  Patient location during evaluation: Endoscopy Anesthesia Type: General Level of consciousness: awake and alert Pain management: pain level controlled Vital Signs Assessment: post-procedure vital signs reviewed and stable Respiratory status: spontaneous breathing, nonlabored ventilation, respiratory function stable and patient connected to nasal cannula oxygen Cardiovascular status: blood pressure returned to baseline and stable Postop Assessment: no apparent nausea or vomiting Anesthetic complications: no   No notable events documented.   Last Vitals:  Vitals:   09/18/24 1351 09/18/24 1401  BP: 133/65 134/68  Pulse: 70 68  Resp: 20 17  Temp:    SpO2: 95% 98%    Last Pain:  Vitals:   09/18/24 1401  TempSrc:   PainSc: 0-No pain                 Lendia LITTIE Mae

## 2024-09-18 NOTE — H&P (Signed)
 Outpatient short stay form Pre-procedure 09/18/2024 12:45 PM Simeon Vera K. Aundria, M.D.  Primary Physician: Selinda Quan, PA-C  Reason for visit:  Dysphagia, GERD, Nausea and vomiting  History of present illness:  Kaitlyn Rivas is an 83 year old female with a history of duodenal ulcer surgery who presents with worsening swallowing difficulties and vomiting. She is accompanied by her daughter, with whom she lives.  Over the past six months, she has experienced worsening dysphagia with both liquids and solids getting stuck, leading to postprandial vomiting despite slow eating. Her voice has become shaky, though the cause is unclear. A barium swallow in April 2025 revealed tertiary esophageal contractions, and she has had episodes of tablets getting lodged in her esophagus.  She underwent stomach surgery for a duodenal ulcer approximately 45 years ago. She questions whether surgical staples could be contributing to her current symptoms. She denies heartburn or reflux symptoms and was initially unaware she was taking any medications for reflux, though pantoprazole 40 mg daily is listed among her medications.  She experiences diarrhea, often having a bowel movement after every meal, with loose stools and occasional gas and bloating. She does not wake up at night for bowel movements and denies any bleeding with bowel movements. She believes she has lost a couple of pounds, although her weight appears stable on the scale.   She had a esophagram performed on 09/26/23: ESOPHAGUS/BARIUM SWALLOW/TABLET STUDY   TECHNIQUE: Combined double and single contrast examination was performed using effervescent crystals, high-density barium, and thin liquid barium. This exam was performed by Delon Beagle NP and was supervised and interpreted by Dr. Felton Fry   FLUOROSCOPY: Radiation Exposure Index (as provided by the fluoroscopic device): 7.10 mGy Kerma   COMPARISON:  Esophagram dated May 07, 2021.   FINDINGS: Swallowing: Appears normal. Vestibular penetration seen with thin liquid.   Pharynx: Unremarkable.   Esophagus: Corkscrew appearance of the mid to distal esophagus.   Esophageal motility: Poor motility with numerous tertiary (non propulsive) contractions noted in the mid and distal esophagus.   Hiatal Hernia: None.   Gastroesophageal reflux: None visualized.   Ingested 13mm barium tablet: Became stuck despite the aid of additional liquid.   Other: None.   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 This resulted in the barium tablet becoming stuck, despite the absence of an identifiable stricture.     Electronically Signed   By: Felton Fry M.D.   On: 09/26/2023 12:09  Current Facility-Administered Medications:    0.9 %  sodium chloride  infusion, , Intravenous, Continuous, Roselinda Bahena K, MD  Medications Prior to Admission  Medication Sig Dispense Refill Last Dose/Taking   citalopram (CELEXA) 40 MG tablet Take 40 mg by mouth daily.   09/17/2024   Multiple Vitamin (MULTIVITAMIN) tablet Take 1 tablet by mouth daily.   Past Week   pravastatin (PRAVACHOL) 10 MG tablet Take 10 mg by mouth daily.   09/17/2024   vitamin B-12 (CYANOCOBALAMIN) 1000 MCG tablet Take 1,000 mcg by mouth daily.   Past Week   Vitamin D3 (VITAMIN D) 25 MCG tablet Take 1,000 Units by mouth daily.   Past Week   aspirin EC 81 MG tablet Take 81 mg by mouth daily. (Patient not taking: Reported on 09/18/2024)   Not Taking     No Known Allergies   Past Medical History:  Diagnosis Date   Anxiety    High cholesterol  Osteoporosis     Review of systems:  Otherwise negative.    Physical Exam  Gen: Alert, oriented. Appears stated age.  HEENT: [place]/AT. PERRLA. Lungs: CTA, no wheezes. CV: RR nl S1, S2. Abd: soft, benign, no masses. BS+ Ext: No edema. Pulses  2+    Planned procedures: Proceed with EGD. The patient understands the nature of the planned procedure, indications, risks, alternatives and potential complications including but not limited to bleeding, infection, perforation, damage to internal organs and possible oversedation/side effects from anesthesia. The patient agrees and gives consent to proceed.  Please refer to procedure notes for findings, recommendations and patient disposition/instructions.     Aleksei Goodlin K. Aundria, M.D. Gastroenterology 09/18/2024  12:45 PM

## 2024-09-18 NOTE — Interval H&P Note (Signed)
 History and Physical Interval Note:  09/18/2024 12:48 PM  Kaitlyn Rivas  has presented today for surgery, with the diagnosis of Pharyngoesophageal dysphagia (R13.14) Gastroesophageal reflux disease without esophagitis (K21.9) Bilious vomiting with nausea (R11.14).  The various methods of treatment have been discussed with the patient and family. After consideration of risks, benefits and other options for treatment, the patient has consented to  Procedure(s): EGD (ESOPHAGOGASTRODUODENOSCOPY) (N/A) as a surgical intervention.  The patient's history has been reviewed, patient examined, no change in status, stable for surgery.  I have reviewed the patient's chart and labs.  Questions were answered to the patient's satisfaction.     [place], [place]

## 2024-09-18 NOTE — Anesthesia Preprocedure Evaluation (Signed)
 Anesthesia Evaluation  Patient identified by MRN, date of birth, ID band Patient awake    Reviewed: Allergy & Precautions, NPO status , Patient's Chart, lab work & pertinent test results  History of Anesthesia Complications Negative for: history of anesthetic complications  Airway Mallampati: III  TM Distance: >3 FB Neck ROM: full    Dental no notable dental hx.    Pulmonary neg pulmonary ROS   Pulmonary exam normal        Cardiovascular negative cardio ROS Normal cardiovascular exam     Neuro/Psych  PSYCHIATRIC DISORDERS Anxiety      C-spine not cleared negative neurological ROS     GI/Hepatic negative GI ROS, Neg liver ROS,,,  Endo/Other  negative endocrine ROS    Renal/GU negative Renal ROS  negative genitourinary   Musculoskeletal   Abdominal   Peds  Hematology negative hematology ROS (+)   Anesthesia Other Findings Past Medical History: No date: Anxiety No date: High cholesterol No date: Osteoporosis  Past Surgical History: No date: CHOLECYSTECTOMY 07/15/2021: ESOPHAGOGASTRODUODENOSCOPY (EGD) WITH PROPOFOL ; N/A     Comment:  Procedure: ESOPHAGOGASTRODUODENOSCOPY (EGD) WITH               PROPOFOL ;  Surgeon: Toledo, Ladell POUR, MD;  Location:               [facility] ENDOSCOPY;  Service: Gastroenterology;  Laterality:               N/A; No date: excision stomach ulcer No date: OOPHORECTOMY No date: TUBAL LIGATION     Reproductive/Obstetrics negative OB ROS                              Anesthesia Physical Anesthesia Plan  ASA: 2  Anesthesia Plan: General   Post-op Pain Management: Minimal or no pain anticipated   Induction: Intravenous  PONV Risk Score and Plan: 2 and Propofol  infusion and TIVA  Airway Management Planned: Natural Airway and Nasal Cannula  Additional Equipment:   Intra-op Plan:   Post-operative Plan:   Informed Consent: I have reviewed the patients  History and Physical, chart, labs and discussed the procedure including the risks, benefits and alternatives for the proposed anesthesia with the patient or authorized representative who has indicated his/her understanding and acceptance.     Dental Advisory Given  Plan Discussed with: Anesthesiologist, CRNA and Surgeon  Anesthesia Plan Comments: (Patient consented for risks of anesthesia including but not limited to:  - adverse reactions to medications - risk of airway placement if required - damage to eyes, teeth, lips or other oral mucosa - nerve damage due to positioning  - sore throat or hoarseness - Damage to heart, brain, nerves, lungs, other parts of body or loss of life  Patient voiced understanding and assent.)        Anesthesia Quick Evaluation

## 2024-09-18 NOTE — Transfer of Care (Signed)
 Immediate Anesthesia Transfer of Care Note  Patient: Kaitlyn Rivas  Procedure(s) Performed: EGD (ESOPHAGOGASTRODUODENOSCOPY)  Patient Location: PACU  Anesthesia Type:General  Level of Consciousness: sedated  Airway & Oxygen Therapy: Patient Spontanous Breathing  Post-op Assessment: Report given to RN and Post -op Vital signs reviewed and stable  Post vital signs: Reviewed and stable  Last Vitals:  Vitals Value Taken Time  BP 119/69 09/18/24 13:41  Temp 35.7 C 09/18/24 13:41  Pulse 80 09/18/24 13:43  Resp 23 09/18/24 13:43  SpO2 94 % 09/18/24 13:43  Vitals shown include unfiled device data.  Last Pain:  Vitals:   09/18/24 1341  TempSrc: Temporal  PainSc: Asleep         Complications: No notable events documented.

## 2024-09-18 NOTE — Interval H&P Note (Signed)
 History and Physical Interval Note:  09/18/2024 12:49 PM  Kaitlyn Rivas  has presented today for surgery, with the diagnosis of Pharyngoesophageal dysphagia (R13.14) Gastroesophageal reflux disease without esophagitis (K21.9) Bilious vomiting with nausea (R11.14).  The various methods of treatment have been discussed with the patient and family. After consideration of risks, benefits and other options for treatment, the patient has consented to  Procedure(s): EGD (ESOPHAGOGASTRODUODENOSCOPY) (N/A) as a surgical intervention.  The patient's history has been reviewed, patient examined, no change in status, stable for surgery.  I have reviewed the patient's chart and labs.  Questions were answered to the patient's satisfaction.     [place], [place]

## 2024-09-18 NOTE — Op Note (Signed)
 [facility] Gastroenterology Patient Name: Kaitlyn Rivas Procedure Date: 09/18/2024 1:26 PM MRN: [id number] Account #: [id number] Date of Birth: [birth year] Admit Type: Outpatient Age: 83 Room: [facility] ENDO ROOM 1 Gender: Female Note Status: Finalized Instrument Name: Barnie GI Scope [id number] Procedure:             Upper GI endoscopy Indications:           Dysphagia, Gastro-esophageal reflux disease, Nausea                         with vomiting Providers:             Bertine Schlottman K. Aundria MD, MD Referring MD:          Selinda Azalee Quan (Referring MD) Medicines:             Propofol  per Anesthesia Complications:         No immediate complications. Estimated blood loss: None. Procedure:             Pre-Anesthesia Assessment:                        - The risks and benefits of the procedure and the                         sedation options and risks were discussed with the                         patient. All questions were answered and informed                         consent was obtained.                        - Patient identification and proposed procedure were                         verified prior to the procedure by the nurse. The                         procedure was verified in the procedure room.                        - ASA Grade Assessment: III - A patient with severe                         systemic disease.                        - After reviewing the risks and benefits, the patient                         was deemed in satisfactory condition to undergo the                         procedure.                        After obtaining informed consent, the endoscope was  passed under direct vision. Throughout the procedure,                         the patient's blood pressure, pulse, and oxygen                         saturations were monitored continuously. The Endoscope                         was introduced through the mouth, and  advanced to the                         third part of duodenum. The upper GI endoscopy was                         accomplished without difficulty. The patient tolerated                         the procedure well. Findings:      Abnormal motility was noted in the esophagus. The cricopharyngeus was       abnormal. There is spasticity of the esophageal body. The distal       esophagus/lower esophageal sphincter is open. Tertiary peristaltic waves       are noted.      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 A gastric       pouch with a large size was found containing food debris. The       gastroduodenal anastomosis was characterized by congestion and erythema.       This was traversed.      The examined duodenum was normal.      The exam of the stomach was otherwise normal.      A medium amount of food (residue) was found in the gastric fundus.      No other significant abnormalities were identified in a careful       examination of the stomach.      A non-bleeding diverticulum with a small opening and no stigmata of       recent bleeding was found in the middle third of the esophagus.       Estimated blood loss: none.      No other significant abnormalities were identified in a careful       examination of the esophagus. Impression:            - Abnormal esophageal motility.                        - Patent Billroth I gastroduodenostomy was found,                         characterized by congestion and erythema.                        - Normal examined duodenum.                        - A medium amount of food (residue) in the stomach.                        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                        -  No specimens collected. Recommendation:        - Patient has a contact number available for                         emergencies. The signs and symptoms of potential                         delayed complications were discussed with  the patient.                         Return to normal activities tomorrow. Written                         discharge instructions were provided to the patient.                        - Advance diet as tolerated.                        - Continue present medications.                        - Return to my office PRN.                        - The findings and recommendations were discussed with                         the patient. Procedure Code(s):     --- Professional ---                        [phone], Esophagogastroduodenoscopy, flexible,                         transoral; diagnostic, including collection of                         specimen(s) by brushing or washing, when performed                         (separate procedure) Diagnosis Code(s):     --- Professional ---                        R11.2, Nausea with vomiting, unspecified                        K21.9, Gastro-esophageal reflux disease without                         esophagitis                        R13.10, Dysphagia, unspecified                        Q39.6, Congenital diverticulum of esophagus                        Z98.0, Intestinal bypass and anastomosis status  K22.4, Dyskinesia of esophagus CPT copyright 2022 [employer]. All rights reserved. The codes documented in this report are preliminary and upon coder review may  be revised to meet current compliance requirements. Attending Participation:      I personally performed the entire procedure. Ladell MARLA Boss MD, MD 09/18/2024 1:44:51 PM This report has been signed electronically. Number of Addenda: 0 Note Initiated On: 09/18/2024 1:26 PM Estimated Blood Loss:  Estimated blood loss: none. Estimated blood loss:                         none. Estimated blood loss: none. Estimated blood                         loss: none.      [facility]

## 2024-11-02 ENCOUNTER — Other Ambulatory Visit: Payer: Self-pay | Admitting: Family Medicine

## 2024-11-02 ENCOUNTER — Ambulatory Visit
Admission: RE | Admit: 2024-11-02 | Discharge: 2024-11-02 | Disposition: A | Discharge status: Home / Self Care | Source: Ambulatory Visit | Attending: Family Medicine | Admitting: Family Medicine

## 2024-11-02 ENCOUNTER — Other Ambulatory Visit

## 2024-11-02 DIAGNOSIS — R10A2 Flank pain, left side: Secondary | ICD-10-CM

## 2024-11-02 DIAGNOSIS — R1032 Left lower quadrant pain: Secondary | ICD-10-CM | POA: Diagnosis present

## 2024-11-02 MED ORDER — IOHEXOL 300 MG/ML  SOLN
80.0000 mL | Freq: Once | INTRAMUSCULAR | Status: AC | PRN
  Administered 2024-11-02: 80 mL via INTRAVENOUS
